# Patient Record
Sex: Female | Born: 1960 | Race: Asian | Hispanic: No | Marital: Married | Smoking: Never smoker
Health system: Southern US, Community
[De-identification: ages and names within clinical notes are randomized; demographics above are authoritative.]

## PROBLEM LIST (undated history)

## (undated) DIAGNOSIS — I10 Essential (primary) hypertension: Secondary | ICD-10-CM

## (undated) DIAGNOSIS — B1910 Unspecified viral hepatitis B without hepatic coma: Secondary | ICD-10-CM

## (undated) DIAGNOSIS — R7303 Prediabetes: Secondary | ICD-10-CM

## (undated) DIAGNOSIS — M8580 Other specified disorders of bone density and structure, unspecified site: Secondary | ICD-10-CM

## (undated) DIAGNOSIS — K760 Fatty (change of) liver, not elsewhere classified: Secondary | ICD-10-CM

## (undated) HISTORY — DX: Unspecified viral hepatitis B without hepatic coma: B19.10

## (undated) HISTORY — DX: Fatty (change of) liver, not elsewhere classified: K76.0

## (undated) HISTORY — DX: Other specified disorders of bone density and structure, unspecified site: M85.80

## (undated) HISTORY — DX: Prediabetes: R73.03

## (undated) HISTORY — PX: CHOLECYSTECTOMY: SHX55

## (undated) HISTORY — DX: Essential (primary) hypertension: I10

---

## 2007-03-10 DIAGNOSIS — B181 Chronic viral hepatitis B without delta-agent: Secondary | ICD-10-CM | POA: Insufficient documentation

## 2014-11-25 ENCOUNTER — Other Ambulatory Visit: Payer: Self-pay | Admitting: Unknown Physician Specialty

## 2014-11-25 ENCOUNTER — Ambulatory Visit (INDEPENDENT_AMBULATORY_CARE_PROVIDER_SITE_OTHER): Payer: 59

## 2014-11-25 DIAGNOSIS — M545 Low back pain: Secondary | ICD-10-CM

## 2015-11-21 DIAGNOSIS — Z8619 Personal history of other infectious and parasitic diseases: Secondary | ICD-10-CM | POA: Insufficient documentation

## 2016-02-07 ENCOUNTER — Encounter: Payer: Self-pay | Admitting: *Deleted

## 2016-08-23 DIAGNOSIS — B009 Herpesviral infection, unspecified: Secondary | ICD-10-CM | POA: Insufficient documentation

## 2016-10-17 IMAGING — CR DG LUMBAR SPINE COMPLETE 4+V
5 series · 5 of 5 positions shown · non-contrast
Comparison: None.

CLINICAL DATA: Left lumbar spine pain for 6 months with no injury

EXAM:
LUMBAR SPINE - COMPLETE 4+ VIEW

[l-spine ap]
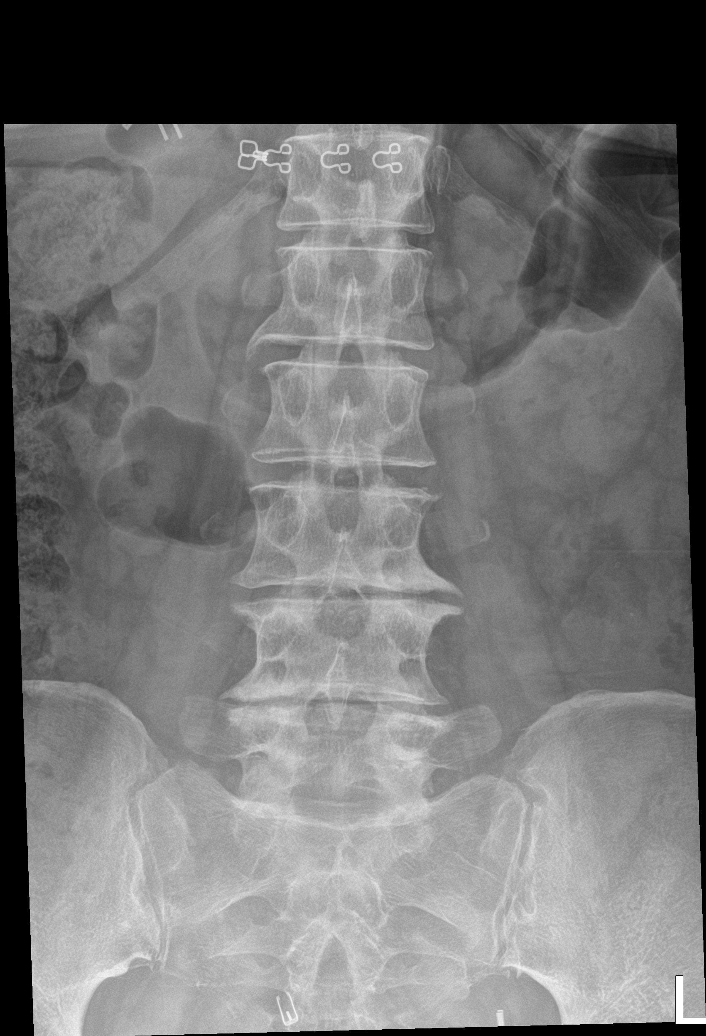

[l-spine obl (1 of 2)]
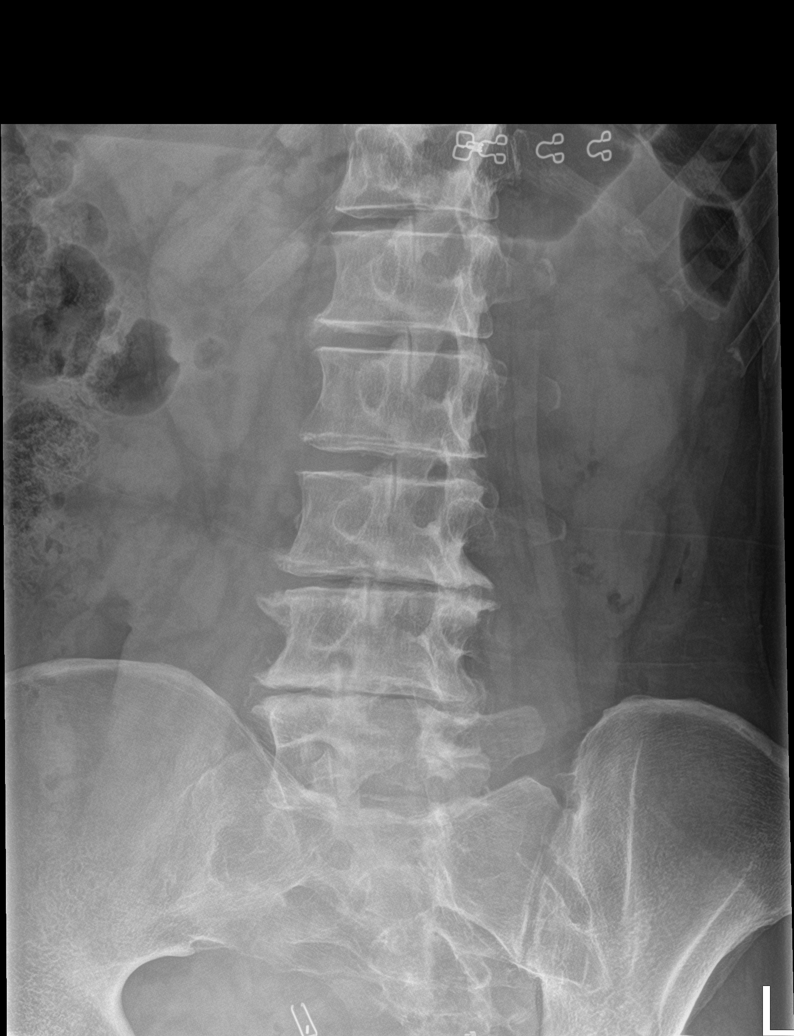

[l-spine obl (2 of 2)]
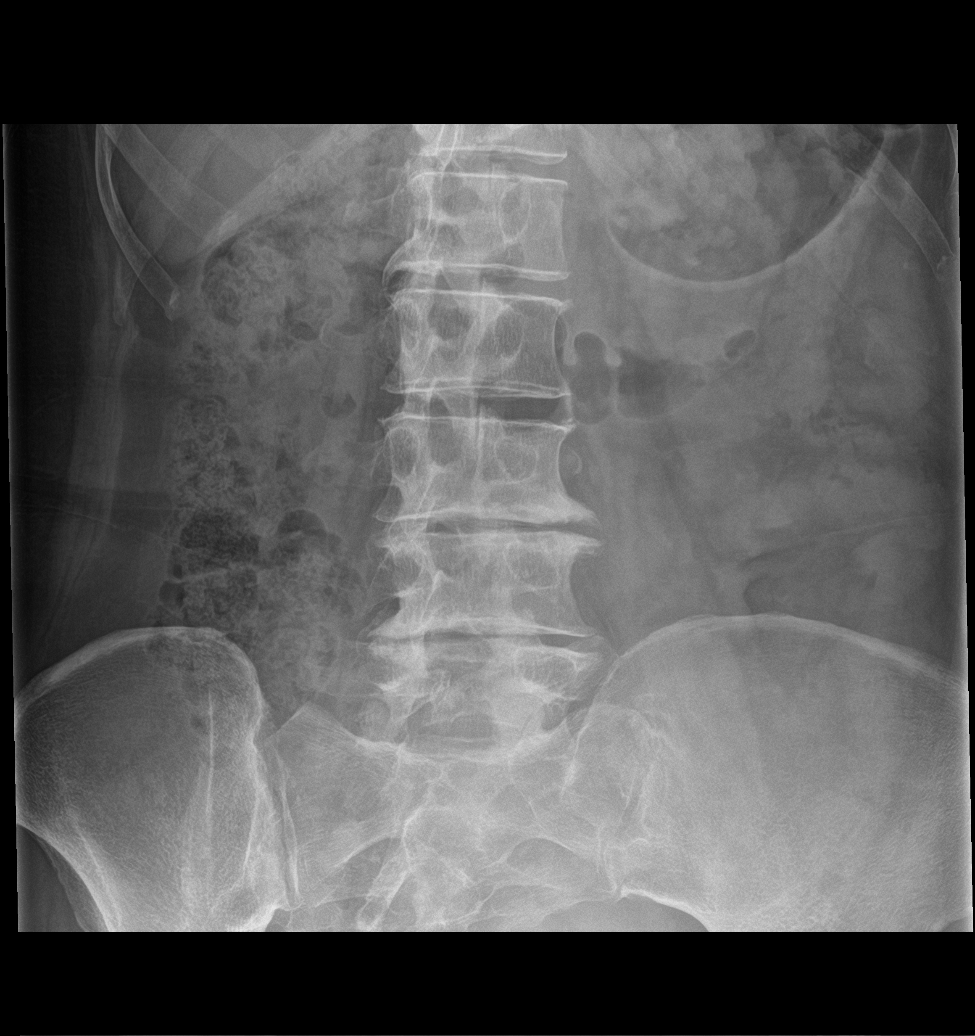

[l-spine lat]
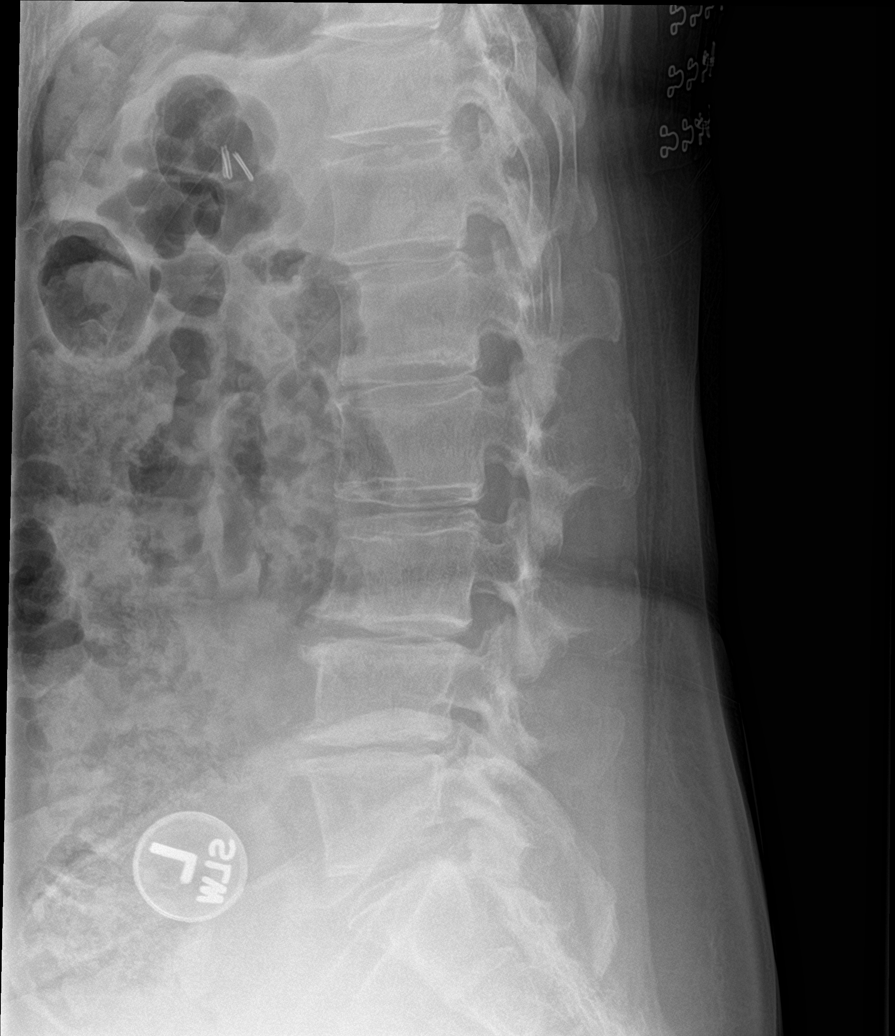

[l-spine spot]
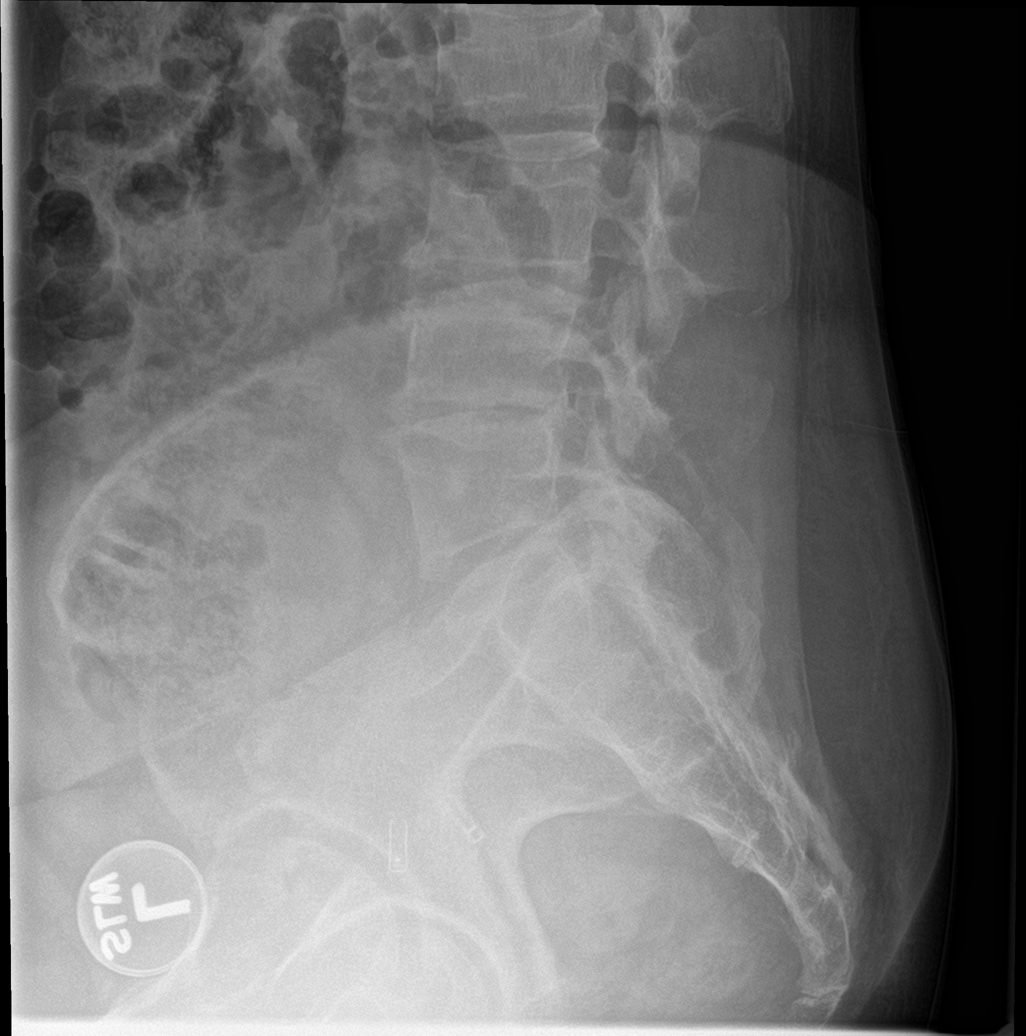

[5 of 5 positions shown; findings below may reference images not displayed]

FINDINGS: Reversed lordosis. No fracture. Mild degenerative disc disease at
L1-2 and L2-3. Moderate L3-4 and L4-5 degenerative disc disease.
Facet arthropathy bilaterally at L4-5 and L5-S1.
IMPRESSION: Degenerative changes

## 2017-01-08 ENCOUNTER — Encounter: Payer: Self-pay | Admitting: *Deleted

## 2017-01-08 DIAGNOSIS — Z23 Encounter for immunization: Secondary | ICD-10-CM

## 2017-01-10 ENCOUNTER — Encounter: Payer: Self-pay | Admitting: *Deleted

## 2017-01-10 NOTE — Congregational Nurse Program (Unsigned)
Congregational Nurse Program Note  Date of Encounter:01/08/2017 Past Medical History: No past medical history on file.  Encounter Details:  flu shot given on 01/08/2017

## 2017-05-20 DIAGNOSIS — M8588 Other specified disorders of bone density and structure, other site: Secondary | ICD-10-CM | POA: Insufficient documentation

## 2017-07-04 ENCOUNTER — Encounter: Payer: Self-pay | Admitting: *Deleted

## 2017-07-04 DIAGNOSIS — Z139 Encounter for screening, unspecified: Secondary | ICD-10-CM

## 2017-07-04 LAB — GLUCOSE, POCT (MANUAL RESULT ENTRY): POC Glucose: 155 mg/dl — AB (ref 70–99)

## 2018-02-04 ENCOUNTER — Encounter: Payer: Self-pay | Admitting: *Deleted

## 2018-03-22 ENCOUNTER — Encounter: Payer: Self-pay | Admitting: *Deleted

## 2018-03-22 NOTE — Congregational Nurse Program (Signed)
  Dept: [phone]   Congregational Nurse Program Note  Date of Encounter: 03/21/2018 Past Medical History: No past medical history on file.  Encounter Details:  c/o BP up, checking BP, was high. Advised to call MD, ask BP med. Law salt diet and exercise recommended. Will f/u

## 2018-05-11 ENCOUNTER — Encounter: Payer: Self-pay | Admitting: *Deleted

## 2018-05-11 NOTE — Congregational Nurse Program (Unsigned)
  Dept: [phone]   Congregational Nurse Program Note  Date of Encounter: 05/11/2018  Past Medical History: No past medical history on file.  Encounter Details: CNP Questionnaire - 05/11/18 1055      Questionnaire   Patient Status  Immigrant    Race  Asian    Location Patient Served At  Not Applicable    Insurance  Private Insurance    Uninsured  Not Applicable    Food  No food insecurities    Housing/Utilities  Yes, have permanent housing    Transportation  No transportation needs    Interpersonal Safety  Yes, feel physically and emotionally safe where you currently live    Medication  No medication insecurities    Medical Provider  Yes    Referrals  Not Applicable    ED Visit Averted  Not Applicable    Life-Saving Intervention Made  Not Applicable     check BP, remains high.  Pt said she is taking beets ,apple and carrot smoothie every day for HTN, advised to go to Dr. And get BP med. Control BP then do food therapy. Also stated when she took BP was not that high. Will F/U.

## 2019-05-28 ENCOUNTER — Telehealth: Payer: Self-pay | Admitting: *Deleted

## 2019-05-28 NOTE — Telephone Encounter (Signed)
Pt said she fell on Monday at home, hit head. Been ER on Monday. CT was negative bleeding , remains HA.  She did not remember anything why where, when fell at home, just remembered her daughter told her to go to ER. Reminded her to follow up with Dr. To find out why she had syncopal episode. Example of heart? Or brain. She is taking BP med but was not low BP. Will f/u with her.

## 2019-06-12 DIAGNOSIS — I10 Essential (primary) hypertension: Secondary | ICD-10-CM | POA: Insufficient documentation

## 2019-07-09 ENCOUNTER — Ambulatory Visit: Payer: 59 | Attending: Internal Medicine

## 2019-07-09 DIAGNOSIS — Z23 Encounter for immunization: Secondary | ICD-10-CM

## 2019-07-09 NOTE — Progress Notes (Signed)
   Covid-19 Vaccination Clinic  Name:  Rylin Saez    MRN: [id number] DOB: 1960/06/07  07/09/2019  Ms. Latona was observed post Covid-19 immunization for 15 minutes without incident. She was provided with Vaccine Information Sheet and instruction to access the V-Safe system.   Ms. Holtmeyer was instructed to call 911 with any severe reactions post vaccine: Marland Kitchen Difficulty breathing  . Swelling of face and throat  . A fast heartbeat  . A bad rash all over body  . Dizziness and weakness   Immunizations Administered    Name Date Dose VIS Date Route   Pfizer COVID-19 Vaccine 07/09/2019 10:26 AM 0.3 mL 04/10/2019 Intramuscular   Manufacturer: [employer], [employer]   Lot: [id number]   NDC: [address]-2

## 2019-08-03 ENCOUNTER — Ambulatory Visit: Payer: 59 | Attending: Internal Medicine

## 2019-08-03 DIAGNOSIS — Z23 Encounter for immunization: Secondary | ICD-10-CM

## 2019-08-03 NOTE — Progress Notes (Signed)
   Covid-19 Vaccination Clinic  Name:  Vergie Zahm    MRN: [id number] DOB: [birth year]  08/03/2019  Ms. Tye was observed post Covid-19 immunization for 15 minutes without incident. She was provided with Vaccine Information Sheet and instruction to access the V-Safe system.   Ms. Reith was instructed to call 911 with any severe reactions post vaccine: Marland Kitchen Difficulty breathing  . Swelling of face and throat  . A fast heartbeat  . A bad rash all over body  . Dizziness and weakness   Immunizations Administered    Name Date Dose VIS Date Route   Pfizer COVID-19 Vaccine 08/03/2019  9:59 AM 0.3 mL 04/10/2019 Intramuscular   Manufacturer: [employer], [employer]   Lot: [id number]   NDC: [address]-2

## 2020-01-03 ENCOUNTER — Encounter: Payer: Self-pay | Admitting: *Deleted

## 2021-03-05 DIAGNOSIS — R7303 Prediabetes: Secondary | ICD-10-CM | POA: Insufficient documentation

## 2021-03-05 DIAGNOSIS — K760 Fatty (change of) liver, not elsewhere classified: Secondary | ICD-10-CM | POA: Insufficient documentation

## 2021-09-05 DIAGNOSIS — M4722 Other spondylosis with radiculopathy, cervical region: Secondary | ICD-10-CM | POA: Insufficient documentation

## 2022-05-16 LAB — HM DEXA SCAN

## 2022-06-28 DIAGNOSIS — B351 Tinea unguium: Secondary | ICD-10-CM | POA: Insufficient documentation

## 2022-06-28 DIAGNOSIS — E663 Overweight: Secondary | ICD-10-CM | POA: Insufficient documentation

## 2022-06-28 DIAGNOSIS — R609 Edema, unspecified: Secondary | ICD-10-CM | POA: Insufficient documentation

## 2022-08-14 DIAGNOSIS — Z8639 Personal history of other endocrine, nutritional and metabolic disease: Secondary | ICD-10-CM | POA: Insufficient documentation

## 2023-03-27 LAB — LIPID PANEL: LDL Cholesterol: 122

## 2023-03-27 LAB — HEMOGLOBIN A1C: Hemoglobin A1C: 5.7

## 2023-04-03 LAB — HM PAP SMEAR: HM Pap smear: NORMAL

## 2023-09-18 ENCOUNTER — Ambulatory Visit (INDEPENDENT_AMBULATORY_CARE_PROVIDER_SITE_OTHER): Payer: Self-pay | Admitting: Family Medicine

## 2023-09-18 ENCOUNTER — Encounter: Payer: Self-pay | Admitting: Family Medicine

## 2023-09-18 VITALS — BP 146/86 | HR 75 | Temp 97.8°F | Ht 62.0 in | Wt 137.1 lb

## 2023-09-18 DIAGNOSIS — B181 Chronic viral hepatitis B without delta-agent: Secondary | ICD-10-CM

## 2023-09-18 DIAGNOSIS — Z87440 Personal history of urinary (tract) infections: Secondary | ICD-10-CM | POA: Insufficient documentation

## 2023-09-18 DIAGNOSIS — R7303 Prediabetes: Secondary | ICD-10-CM

## 2023-09-18 DIAGNOSIS — I10 Essential (primary) hypertension: Secondary | ICD-10-CM

## 2023-09-18 DIAGNOSIS — G479 Sleep disorder, unspecified: Secondary | ICD-10-CM | POA: Diagnosis not present

## 2023-09-18 NOTE — Assessment & Plan Note (Signed)
 Currently taking Macrobid 100 mg BID.  This is day # 3. Symptoms are slowly improving. No fever of flank pain.  Recommend completing treatment, if symptoms are not resolved with send urine for culture. Repeat u/a in 2 weeks to ensure resolution of hematuria.

## 2023-09-18 NOTE — Assessment & Plan Note (Signed)
 Currently managed by infectious disease.

## 2023-09-18 NOTE — Assessment & Plan Note (Signed)
 Reports irregular sleep patterns. Sometimes stays up all night and sleeps during the day. Recommend avoiding all daytime sleep and good sleep hygiene.  Continue to monitor.

## 2023-09-18 NOTE — Progress Notes (Signed)
 New Patient Office Visit  Subjective    Patient ID: Anna Mora, female    DOB: 1960/11/30  Age: 63 y.o. MRN: [id number]  CC:  Chief Complaint  Patient presents with   New Patient (Initial Visit)    [facility] pt is being treated for an UTI as well with nitrofurantoin, macrocrystal-monohydrate, (MACROBID) 100 MG capsule. She is on her 3rd day     HPI Anna Mora presents to establish care with this practice. She is new to me.   UTI:  Went to urgent care for UTI and is on treatment. No culture was sent.  Temperature 99.0 yesterday. Continues to have pain with urination.  No flank pain. Does not feel ill.   HTN: losartan 50 mg daily. Has not taken blood pressure medicine today. Elevated today.   Pre- Diabetes: metformin 500 mg every day.  Not sure when last A1C was checked.   Sleeping habits: sometimes stays up all night and naps during the day.   Chart review: 09/16/23: [facility] for [facility] Started on Macrobid. Blood in urine. Positive leukocytes  Outpatient Encounter Medications as of 09/18/2023  Medication Sig   albuterol (VENTOLIN HFA) 108 (90 Base) MCG/ACT inhaler Inhale 2 puffs into the lungs every 6 (six) hours as needed.   entecavir (BARACLUDE) 0.5 MG tablet Take 0.5 mg by mouth daily.   losartan (COZAAR) 50 MG tablet Take 50 mg by mouth daily.   metFORMIN (GLUCOPHAGE-XR) 500 MG 24 hr tablet Take 500 mg by mouth daily with breakfast.   nitrofurantoin, macrocrystal-monohydrate, (MACROBID) 100 MG capsule Take 100 mg by mouth.   tenofovir (VIREAD) 300 MG tablet Take 300 mg by mouth daily.   valACYclovir (VALTREX) 1000 MG tablet Take 1,000 mg by mouth daily.   [DISCONTINUED] estradiol (ESTRACE) 0.1 MG/GM vaginal cream Place 1 Applicatorful vaginally once a week.   No facility-administered encounter medications on file as of 09/18/2023.    Past Medical History:  Diagnosis Date   Hepatitis B    High blood pressure     Past Surgical History:  Procedure  Laterality Date   CHOLECYSTECTOMY      History reviewed. No pertinent family history.  Social History   Socioeconomic History   Marital status: Married    Spouse name: Not on file   Number of children: 2   Years of education: Not on file   Highest education level: Not on file  Occupational History   Not on file  Tobacco Use   Smoking status: Never   Smokeless tobacco: Never  Substance and Sexual Activity   Alcohol use: Not on file   Drug use: Never   Sexual activity: Not on file  Other Topics Concern   Not on file  Social History Narrative   Not on file   Social Drivers of Health   Financial Resource Strain: Low Risk  (07/01/2023)   Received from [facility]   Overall Financial Resource Strain (CARDIA)    Difficulty of Paying Living Expenses: Not hard at all  Food Insecurity: No Food Insecurity (07/01/2023)   Received from [facility]   Hunger Vital Sign    Worried About Running Out of Food in the Last Year: Never true    Ran Out of Food in the Last Year: Never true  Transportation Needs: No Transportation Needs (07/01/2023)   Received from [facility] - Transportation    Lack of Transportation (Medical): No    Lack of Transportation (Non-Medical): No  Physical  Activity: Unknown (03/31/2023)   Received from [facility]   Exercise Vital Sign    Days of Exercise per Week: 3 days    Minutes of Exercise per Session: Not on file  Stress: Patient Declined (03/31/2023)   Received from [facility] - Occupational Stress Questionnaire    Feeling of Stress : Patient declined  Social Connections: Socially Integrated (03/31/2023)   Received from [facility]   Social Network    How would you rate your social network (family, work, friends)?: Good participation with social networks  Intimate Partner Violence: Not At Risk (03/31/2023)   Received from [facility]   HITS    Over the last 12 months how often did your  partner physically hurt you?: Never    Over the last 12 months how often did your partner insult you or talk down to you?: Never    Over the last 12 months how often did your partner threaten you with physical harm?: Never    Over the last 12 months how often did your partner scream or curse at you?: Never    Review of Systems  Eyes:  Negative for blurred vision and double vision.  Respiratory:  Negative for shortness of breath.   Cardiovascular:  Negative for chest pain.  Neurological:  Negative for headaches.        Objective    BP (!) 146/86 (BP Location: Right Arm, Patient Position: Sitting, Cuff Size: Normal)   Pulse 75   Temp 97.8 F (36.6 C) (Oral)   Ht 5\' 2"  (1.575 m)   Wt 137 lb 1 oz (62.2 kg)   SpO2 100%   BMI 25.07 kg/m   Physical Exam Vitals and nursing note reviewed.  Constitutional:      General: She is not in acute distress.    Appearance: Normal appearance. She is normal weight.  Cardiovascular:     Rate and Rhythm: Regular rhythm.     Heart sounds: Normal heart sounds.  Pulmonary:     Effort: Pulmonary effort is normal.     Breath sounds: Normal breath sounds.  Skin:    General: Skin is warm and dry.  Neurological:     General: No focal deficit present.     Mental Status: She is alert. Mental status is at baseline.  Psychiatric:        Mood and Affect: Mood normal.        Behavior: Behavior normal.        Thought Content: Thought content normal.        Judgment: Judgment normal.        Assessment & Plan:   Essential hypertension Assessment & Plan: Takes losartan 50 mg daily.  Did not take today before this visit. Elevated in office. Denies chest pain, shortness of breath, vision changes, and headaches. Instructed to take losartan as soon as possible today. No refills needed. Follow-up in 2 weeks for blood pressure check.   Orders: -     Comprehensive metabolic panel with GFR  Prediabetes Assessment & Plan: Taking  metformin 500  mg daily. Unsure of last A1C. Recheck A1C today.   Orders: -     Comprehensive metabolic panel with GFR -     Hemoglobin A1c  History of UTI Assessment & Plan: Currently taking Macrobid 100 mg BID.  This is day # 3. Symptoms are slowly improving. No fever of flank pain.  Recommend completing treatment, if symptoms are not resolved  with send urine for culture. Repeat u/a in 2 weeks to ensure resolution of hematuria.    Sleep disturbance Assessment & Plan: Reports irregular sleep patterns. Sometimes stays up all night and sleeps during the day. Recommend avoiding all daytime sleep and good sleep hygiene.  Continue to monitor.    Chronic hepatitis B (HCC) Assessment & Plan: Currently managed by infectious disease.      Agrees with plan of care discussed.  Questions answered.   Return in about 12 days (around 09/30/2023) for blood pressure and hematuria .   Mickiel Albany, FNP

## 2023-09-18 NOTE — Patient Instructions (Addendum)
 Please take blood pressure medicine before next office visit.

## 2023-09-18 NOTE — Assessment & Plan Note (Signed)
 Takes losartan 50 mg daily.  Did not take today before this visit. Elevated in office. Denies chest pain, shortness of breath, vision changes, and headaches. Instructed to take losartan as soon as possible today. No refills needed. Follow-up in 2 weeks for blood pressure check.

## 2023-09-18 NOTE — Assessment & Plan Note (Signed)
 Taking  metformin 500 mg daily. Unsure of last A1C. Recheck A1C today.

## 2023-09-19 ENCOUNTER — Ambulatory Visit: Payer: Self-pay | Admitting: Family Medicine

## 2023-09-19 LAB — COMPREHENSIVE METABOLIC PANEL WITH GFR
ALT: 23 IU/L (ref 0–32)
AST: 28 IU/L (ref 0–40)
Albumin: 4.7 g/dL (ref 3.9–4.9)
Alkaline Phosphatase: 69 IU/L (ref 44–121)
BUN/Creatinine Ratio: 27 (ref 12–28)
BUN: 22 mg/dL (ref 8–27)
Bilirubin Total: 1 mg/dL (ref 0.0–1.2)
CO2: 23 mmol/L (ref 20–29)
Calcium: 9.7 mg/dL (ref 8.7–10.3)
Chloride: 102 mmol/L (ref 96–106)
Creatinine, Ser: 0.81 mg/dL (ref 0.57–1.00)
Globulin, Total: 2.4 g/dL (ref 1.5–4.5)
Glucose: 91 mg/dL (ref 70–99)
Potassium: 4.7 mmol/L (ref 3.5–5.2)
Sodium: 142 mmol/L (ref 134–144)
Total Protein: 7.1 g/dL (ref 6.0–8.5)
eGFR: 82 mL/min/{1.73_m2} (ref >59)

## 2023-09-19 LAB — HEMOGLOBIN A1C
Est. average glucose Bld gHb Est-mCnc: 114 mg/dL
Hgb A1c MFr Bld: 5.6 % (ref 4.8–5.6)

## 2023-09-30 ENCOUNTER — Ambulatory Visit (INDEPENDENT_AMBULATORY_CARE_PROVIDER_SITE_OTHER): Admitting: Family Medicine

## 2023-09-30 ENCOUNTER — Encounter: Payer: Self-pay | Admitting: Family Medicine

## 2023-09-30 VITALS — BP 120/74 | HR 66 | Temp 97.9°F | Ht 62.0 in | Wt 138.0 lb

## 2023-09-30 DIAGNOSIS — I10 Essential (primary) hypertension: Secondary | ICD-10-CM | POA: Diagnosis not present

## 2023-09-30 DIAGNOSIS — R319 Hematuria, unspecified: Secondary | ICD-10-CM

## 2023-09-30 DIAGNOSIS — N3001 Acute cystitis with hematuria: Secondary | ICD-10-CM | POA: Insufficient documentation

## 2023-09-30 LAB — POCT URINALYSIS DIP (CLINITEK)
Glucose, UA: 100 mg/dL — AB
Nitrite, UA: POSITIVE — AB
POC PROTEIN,UA: 100 — AB
Spec Grav, UA: 1.02 (ref 1.010–1.025)
Urobilinogen, UA: 2 U/dL — AB
pH, UA: 5 (ref 5.0–8.0)

## 2023-09-30 MED ORDER — NITROFURANTOIN MONOHYD MACRO 100 MG PO CAPS: 100.0000 mg | ORAL_CAPSULE | Freq: Two times a day (BID) | ORAL | 0 refills | Status: DC

## 2023-09-30 NOTE — Assessment & Plan Note (Addendum)
 Recurring symptoms after completing treatment previously. Urine dip positive for blood, nitrites, and leukocytes.  No flank pain. No fever.  Send for culture. Nitrofurantoin 100 mg BID for 10 days. Extended treatment due to recurring symptoms. Denies being sexually active, urinary incontinence, or wearing wet clothing. If continues to have recurrence, will send to urology for evaluation.  May need to change treatment after culture results.

## 2023-09-30 NOTE — Assessment & Plan Note (Signed)
 Taking losartan 50 mg daily as prescribed.  Denies chest pain, shortness of breath, lower extremity edema, vision changes, headaches.  Pertinent lab work: 5/21 CMP normal  Well controlled in office today. Continue current regimen.  No refills needed today.

## 2023-09-30 NOTE — Progress Notes (Signed)
 Established Patient Office Visit  Subjective   Patient ID: Anna Mora, female    DOB: 22-Feb-1961  Age: 63 y.o. MRN: [id number]  Chief Complaint  Patient presents with   Hypertension    Follow up   Hematuria    Follow up. Still having painful urination. Took 2 doses of Azo.    HPI  Hypertension Medication compliance: Taking losartan 50 mg daily as prescribed.  Denies chest pain, shortness of breath, lower extremity edema, vision changes, headaches.  Pertinent lab work: 5/21 CMP normal  Monitoring at home: varies at home, well controlled in office today.  Tolerating medication well: no side effects reported.  Continue current medication regimen: no change Follow-up: 3 months  Urinary burning, urgency, and frequency: Urine positive for blood, nitrites and leukocytes.  Diagnosed with UTI and treated recently.  Symptoms have returned. Symptoms started yesterday. No flank pain. No fever.       Review of Systems  Constitutional:  Negative for fever.  Eyes:  Negative for blurred vision and double vision.  Respiratory:  Negative for shortness of breath.   Cardiovascular:  Negative for chest pain.  Genitourinary:  Positive for dysuria, frequency, hematuria and urgency. Negative for flank pain.  Neurological:  Negative for headaches.      Objective:     BP 120/74 (BP Location: Left Arm, Patient Position: Sitting, Cuff Size: Normal)   Pulse 66   Temp 97.9 F (36.6 C) (Oral)   Ht 5\' 2"  (1.575 m)   Wt 138 lb (62.6 kg)   LMP  (LMP Unknown)   SpO2 96%   BMI 25.24 kg/m    Physical Exam Vitals and nursing note reviewed.  Constitutional:      Appearance: Normal appearance. She is normal weight. She is not ill-appearing.  Cardiovascular:     Rate and Rhythm: Regular rhythm.     Heart sounds: Normal heart sounds.  Pulmonary:     Effort: Pulmonary effort is normal.     Breath sounds: Normal breath sounds.  Abdominal:     Tenderness: There is no right CVA  tenderness or left CVA tenderness.  Skin:    General: Skin is warm and dry.  Neurological:     General: No focal deficit present.     Mental Status: She is alert. Mental status is at baseline.  Psychiatric:        Mood and Affect: Mood normal.        Behavior: Behavior normal.        Thought Content: Thought content normal.        Judgment: Judgment normal.     Results for orders placed or performed in visit on 09/30/23  POCT URINALYSIS DIP (CLINITEK)  Result Value Ref Range   Color, UA orange (A) yellow   Clarity, UA cloudy (A) clear   Glucose, UA =100 (A) negative mg/dL   Bilirubin, UA small (A) negative   Ketones, POC UA trace (5) (A) negative mg/dL   Spec Grav, UA 4.098 1.191 - 1.025   Blood, UA moderate (A) negative   pH, UA 5.0 5.0 - 8.0   POC PROTEIN,UA =100 (A) negative, trace   Urobilinogen, UA 2.0 (A) 0.2 or 1.0 E.U./dL   Nitrite, UA Positive (A) Negative   Leukocytes, UA Large (3+) (A) Negative      The 10-year ASCVD risk score (Arnett DK, et al., 2019) is: 4.8%    Assessment & Plan:   Problem List Items Addressed This Visit  Essential hypertension - Primary   Taking losartan 50 mg daily as prescribed.  Denies chest pain, shortness of breath, lower extremity edema, vision changes, headaches.  Pertinent lab work: 5/21 CMP normal  Well controlled in office today. Continue current regimen.  No refills needed today.       RESOLVED: Hematuria   Relevant Orders   POCT URINALYSIS DIP (CLINITEK) (Completed)   Acute cystitis with hematuria   Recurring symptoms after completing treatment previously. Urine dip positive for blood, nitrites, and leukocytes.  No flank pain. No fever.  Send for culture. Nitrofurantoin 100 mg BID for 10 days. Extended treatment due to recurring symptoms. Denies being sexually active, urinary incontinence, or wearing wet clothing. If continues to have recurrence, will send to urology for evaluation.  May need to change  treatment after culture results.         Relevant Medications   nitrofurantoin, macrocrystal-monohydrate, (MACROBID) 100 MG capsule   Other Relevant Orders   Urine Culture  Agrees with plan of care discussed.  Questions answered.   Return in about 4 weeks (around 10/28/2023) for UTI and hematuria .    Mickiel Albany, FNP

## 2023-10-02 ENCOUNTER — Ambulatory Visit: Payer: Self-pay | Admitting: Family Medicine

## 2023-10-02 LAB — URINE CULTURE

## 2023-10-28 ENCOUNTER — Encounter: Payer: Self-pay | Admitting: Family Medicine

## 2023-10-28 ENCOUNTER — Ambulatory Visit (INDEPENDENT_AMBULATORY_CARE_PROVIDER_SITE_OTHER): Admitting: Family Medicine

## 2023-10-28 VITALS — BP 101/74 | HR 77 | Temp 98.6°F | Ht 62.0 in | Wt 139.0 lb

## 2023-10-28 DIAGNOSIS — R7303 Prediabetes: Secondary | ICD-10-CM

## 2023-10-28 DIAGNOSIS — R300 Dysuria: Secondary | ICD-10-CM | POA: Insufficient documentation

## 2023-10-28 DIAGNOSIS — L609 Nail disorder, unspecified: Secondary | ICD-10-CM | POA: Diagnosis not present

## 2023-10-28 DIAGNOSIS — I10 Essential (primary) hypertension: Secondary | ICD-10-CM

## 2023-10-28 DIAGNOSIS — R319 Hematuria, unspecified: Secondary | ICD-10-CM | POA: Diagnosis not present

## 2023-10-28 LAB — POCT URINALYSIS DIP (CLINITEK)
Bilirubin, UA: NEGATIVE
Blood, UA: NEGATIVE
Glucose, UA: NEGATIVE mg/dL
Ketones, POC UA: NEGATIVE mg/dL
Leukocytes, UA: NEGATIVE
Nitrite, UA: NEGATIVE
POC PROTEIN,UA: NEGATIVE
Spec Grav, UA: >=1.03 — AB (ref 1.010–1.025)
Urobilinogen, UA: 0.2 U/dL
pH, UA: 6 (ref 5.0–8.0)

## 2023-10-28 MED ORDER — METFORMIN HCL ER 500 MG PO TB24: 500.0000 mg | ORAL_TABLET | Freq: Every day | ORAL | 1 refills | Status: DC

## 2023-10-28 MED ORDER — LOSARTAN POTASSIUM 50 MG PO TABS: 50.0000 mg | ORAL_TABLET | Freq: Every day | ORAL | 1 refills | Status: DC

## 2023-10-28 NOTE — Assessment & Plan Note (Signed)
 Refills sent. Well controlled. Follow-up at CPE.

## 2023-10-28 NOTE — Progress Notes (Signed)
 Established Patient Office Visit  Subjective   Patient ID: Anna Mora, female    DOB: 28-Dec-1960  Age: 63 y.o. MRN: [id number]  Chief Complaint  Patient presents with   Medical Management of Chronic Issues    4 week fup  on dysuria    Follow-up for hematuria. UTI symptoms have resolved. No fever or flank pain. Urine dip is negative for hematuria and signs of infection.  Left great toe nail with white line under nail. This has gotten larger. No pain. Wants to see podiatrist for evaluation.       Review of Systems  Genitourinary:  Negative for dysuria, flank pain, frequency, hematuria and urgency.      Objective:     BP 101/74   Pulse 77   Temp 98.6 F (37 C) (Oral)   Ht 5' 2 (1.575 m)   Wt 139 lb (63 kg)   LMP  (LMP Unknown)   SpO2 100%   BMI 25.42 kg/m  BP Readings from Last 3 Encounters:  10/28/23 101/74  09/30/23 120/74  09/18/23 (!) 146/86      Physical Exam Vitals and nursing note reviewed.  Constitutional:      General: She is not in acute distress.    Appearance: Normal appearance.   Cardiovascular:     Rate and Rhythm: Normal rate.  Pulmonary:     Effort: Pulmonary effort is normal.     Breath sounds: Normal breath sounds.  Feet:     Left foot:     Skin integrity: Skin integrity normal.     Comments: White colored line under left great toe nail. No redness or signs of infection.   Skin:    General: Skin is warm and dry.   Neurological:     General: No focal deficit present.     Mental Status: She is alert. Mental status is at baseline.   Psychiatric:        Mood and Affect: Mood normal.        Behavior: Behavior normal.        Thought Content: Thought content normal.        Judgment: Judgment normal.     Results for orders placed or performed in visit on 10/28/23  POCT URINALYSIS DIP (CLINITEK)  Result Value Ref Range   Color, UA yellow yellow   Clarity, UA clear clear   Glucose, UA negative negative mg/dL    Bilirubin, UA negative negative   Ketones, POC UA negative negative mg/dL   Spec Grav, UA >=8.969 (A) 1.010 - 1.025   Blood, UA negative negative   pH, UA 6.0 5.0 - 8.0   POC PROTEIN,UA negative negative, trace   Urobilinogen, UA 0.2 0.2 or 1.0 E.U./dL   Nitrite, UA Negative Negative   Leukocytes, UA Negative Negative    Last metabolic panel Lab Results  Component Value Date   GLUCOSE 91 09/18/2023   NA 142 09/18/2023   K 4.7 09/18/2023   CL 102 09/18/2023   CO2 23 09/18/2023   BUN 22 09/18/2023   CREATININE 0.81 09/18/2023   EGFR 82 09/18/2023   CALCIUM 9.7 09/18/2023   PROT 7.1 09/18/2023   ALBUMIN 4.7 09/18/2023   LABGLOB 2.4 09/18/2023   BILITOT 1.0 09/18/2023   ALKPHOS 69 09/18/2023   AST 28 09/18/2023   ALT 23 09/18/2023   Last hemoglobin A1c Lab Results  Component Value Date   HGBA1C 5.6 09/18/2023      The 10-year ASCVD risk score (Arnett  DK, et al., 2019) is: 3.4%    Assessment & Plan:   Problem List Items Addressed This Visit     Essential hypertension   Refills sent. Well controlled. Follow-up at CPE.       Relevant Medications   losartan (COZAAR) 50 MG tablet   Prediabetes   Refill sent. Follow-up at CPE with labs.      Relevant Medications   metFORMIN (GLUCOPHAGE-XR) 500 MG 24 hr tablet   Hematuria - Primary   Urine dip negative for hematuria and evidence of infection.       Relevant Orders   POCT URINALYSIS DIP (CLINITEK) (Completed)   Nail abnormality   Referral placed to podiatry.       Relevant Orders   Ambulatory referral to Podiatry  Refill sent for chronic conditions. Not included in medical decision making today.  Agrees with plan of care discussed.  Questions answered.   Return in about 22 weeks (around 03/30/2024) for CPE with labs.    Darice JONELLE Brownie, FNP

## 2023-10-28 NOTE — Assessment & Plan Note (Signed)
 Refill sent. Follow-up at CPE with labs.

## 2023-10-28 NOTE — Assessment & Plan Note (Signed)
 Referral placed to podiatry.

## 2023-10-28 NOTE — Assessment & Plan Note (Signed)
 Urine dip negative for hematuria and evidence of infection.

## 2023-11-07 ENCOUNTER — Ambulatory Visit (INDEPENDENT_AMBULATORY_CARE_PROVIDER_SITE_OTHER): Admitting: Podiatry

## 2023-11-07 ENCOUNTER — Encounter: Payer: Self-pay | Admitting: Podiatry

## 2023-11-07 DIAGNOSIS — B351 Tinea unguium: Secondary | ICD-10-CM | POA: Diagnosis not present

## 2023-11-07 NOTE — Progress Notes (Signed)
  Subjective:  Patient ID: Anna Mora, female    DOB: 1960/11/24,   MRN: [id number]  Chief Complaint  Patient presents with   Nail Problem    I have something growing on my second toe on my right foot.  My toenail is changing color on that toe.  On my left big toe, I have a streak that is getting longer on the toenail.    63 y.o. female presents for concern as above.  . Denies any other pedal complaints. Denies n/v/f/c.   Past Medical History:  Diagnosis Date   Fatty liver    Hepatitis B    High blood pressure    Osteopenia    Prediabetes     Objective:  Physical Exam: Vascular: DP/PT pulses 2/4 bilateral. CFT <3 seconds. Normal hair growth on digits. No edema.  Skin. No lacerations or abrasions bilateral feet. Right second digit nail with distal medial thickened and discoloration. White streak noted to distal half of left hallux nail Musculoskeletal: MMT 5/5 bilateral lower extremities in DF, PF, Inversion and Eversion. Deceased ROM in DF of ankle joint.  Neurological: Sensation intact to light touch.   Assessment:   1. Onychomycosis      Plan:  Patient was evaluated and treated and all questions answered. -Examined patient -Discussed treatment options for painful dystrophic nails  -Clinical picture and Fungal culture was obtained by removing a portion of the hard nail itself from each of the involved toenails using a sterile nail nipper and sent to [facility] lab. Patient tolerated the biopsy procedure well without discomfort or need for anesthesia.  -Discussed fungal nail treatment options including oral, topical, and laser treatments.  -Patient to return in 4 weeks for follow up evaluation and discussion of fungal culture results or sooner if symptoms worsen.   Asberry Failing, DPM

## 2023-11-07 NOTE — Addendum Note (Signed)
 Addended by: WAYLAN ELIDIA PARAS on: 11/07/2023 11:55 AM   Modules accepted: Orders

## 2023-12-05 ENCOUNTER — Ambulatory Visit (INDEPENDENT_AMBULATORY_CARE_PROVIDER_SITE_OTHER): Admitting: Podiatry

## 2023-12-05 ENCOUNTER — Encounter: Payer: Self-pay | Admitting: Podiatry

## 2023-12-05 DIAGNOSIS — B351 Tinea unguium: Secondary | ICD-10-CM | POA: Diagnosis not present

## 2023-12-05 MED ORDER — TERBINAFINE HCL 250 MG PO TABS: 250.0000 mg | ORAL_TABLET | Freq: Every day | ORAL | 0 refills | Status: AC

## 2023-12-05 MED ORDER — CICLOPIROX 8 % EX SOLN: Freq: Every day | CUTANEOUS | 0 refills | Status: AC

## 2023-12-05 NOTE — Progress Notes (Signed)
  Subjective:  Patient ID: Anna Mora, female    DOB: 08-08-1960,   MRN: [id number]  No chief complaint on file.   63 y.o. female presents for follow-up of fungal nails and to discuss culture results.   . Denies any other pedal complaints. Denies n/v/f/c.   Past Medical History:  Diagnosis Date   Fatty liver    Hepatitis B    High blood pressure    Osteopenia    Prediabetes     Objective:  Physical Exam: Vascular: DP/PT pulses 2/4 bilateral. CFT <3 seconds. Normal hair growth on digits. No edema.  Skin. No lacerations or abrasions bilateral feet. Right second digit nail with distal medial thickened and discoloration. White streak noted to distal half of left hallux nail Musculoskeletal: MMT 5/5 bilateral lower extremities in DF, PF, Inversion and Eversion. Deceased ROM in DF of ankle joint.  Neurological: Sensation intact to light touch.   Assessment:   1. Onychomycosis       Plan:  Patient was evaluated and treated and all questions answered. -Examined patient -Discussed treatment options for painful dystrophic nails  -Culture positive for T rubrum.  -Discussed fungal nail treatment options including oral, topical, and laser treatments.  -Most recent LFTs wnl.  - Would like to try lamisil  and penlac . Sent to pharmacy.  -Patient to return in 3 months for recheck.    Asberry Failing, DPM

## 2024-03-06 ENCOUNTER — Encounter: Payer: Self-pay | Admitting: Podiatry

## 2024-03-06 ENCOUNTER — Ambulatory Visit (INDEPENDENT_AMBULATORY_CARE_PROVIDER_SITE_OTHER): Admitting: Podiatry

## 2024-03-06 DIAGNOSIS — M722 Plantar fascial fibromatosis: Secondary | ICD-10-CM | POA: Diagnosis not present

## 2024-03-06 DIAGNOSIS — B351 Tinea unguium: Secondary | ICD-10-CM

## 2024-03-06 NOTE — Patient Instructions (Signed)

## 2024-03-06 NOTE — Progress Notes (Signed)
  Subjective:  Patient ID: Anna Mora, female    DOB: 1960/07/28,   MRN: [id number]  Chief Complaint  Patient presents with   Nail Problem    It's getting better but it's still there.   Foot Pain    I developed a new problem.  My heels hurt, my left one is worst than the right one.    63 y.o. female presents for follow-up of fungal nails.  Relates new concern for bilateral heel pain.  Has been going on for several's.  Relates the left is worse than the right states most of the pain happens in the morning or steps.  She has tried some medication.  Relates this did help some. Denies any other pedal complaints. Denies n/v/f/c.   Past Medical History:  Diagnosis Date   Fatty liver    Hepatitis B    High blood pressure    Osteopenia    Prediabetes     Objective:  Physical Exam: Vascular: DP/PT pulses 2/4 bilateral. CFT <3 seconds. Normal hair growth on digits. No edema.  Skin. No lacerations or abrasions bilateral feet. Right second digit nail with distal medial thickened and discoloration. White streak noted to distal half of left hallux nail Musculoskeletal: MMT 5/5 bilateral lower extremities in DF, PF, Inversion and Eversion. Deceased ROM in DF of ankle joint.  Mildly tender to medial calcaneal tubercle of the left heel.  No pain with calcaneal squeeze.  No pain.  Arch or Achilles.  Similar on the right Neurological: Sensation intact to light touch.   Assessment:   1. Onychomycosis       Plan:  Patient was evaluated and treated and all questions answered. -Examined patient -Discussed treatment options for painful dystrophic nails  -Culture positive for T rubrum.  -Discussed fungal nail treatment options including oral, topical, and laser treatments.  -Will discontinue Lamisil . - Continue Penlac  until cleared  Discussed plantar fasciitis with patient.  X-rays reviewed and discussed with patient. No acute fractures or dislocations noted. Mild spurring noted at inferior  calcaneus.  Discussed treatment options including, ice, NSAIDS, supportive shoes, bracing, and stretching. Stretching exercises provided to be done on a daily basis.   Follow-up as needed     Asberry Failing, DPM

## 2024-03-30 ENCOUNTER — Ambulatory Visit (INDEPENDENT_AMBULATORY_CARE_PROVIDER_SITE_OTHER): Admitting: Family Medicine

## 2024-03-30 ENCOUNTER — Encounter: Payer: Self-pay | Admitting: Family Medicine

## 2024-03-30 VITALS — BP 135/80 | HR 84 | Ht 62.0 in | Wt 139.0 lb

## 2024-03-30 DIAGNOSIS — Z13228 Encounter for screening for other metabolic disorders: Secondary | ICD-10-CM | POA: Diagnosis not present

## 2024-03-30 DIAGNOSIS — Z0000 Encounter for general adult medical examination without abnormal findings: Secondary | ICD-10-CM | POA: Diagnosis not present

## 2024-03-30 DIAGNOSIS — Z13220 Encounter for screening for lipoid disorders: Secondary | ICD-10-CM

## 2024-03-30 DIAGNOSIS — Z130 Encounter for screening for diseases of the blood and blood-forming organs and certain disorders involving the immune mechanism: Secondary | ICD-10-CM | POA: Diagnosis not present

## 2024-03-30 DIAGNOSIS — Z87898 Personal history of other specified conditions: Secondary | ICD-10-CM | POA: Insufficient documentation

## 2024-03-30 DIAGNOSIS — Z136 Encounter for screening for cardiovascular disorders: Secondary | ICD-10-CM

## 2024-03-30 MED ORDER — ALBUTEROL SULFATE HFA 108 (90 BASE) MCG/ACT IN AERS: 2.0000 | INHALATION_SPRAY | Freq: Every evening | RESPIRATORY_TRACT | 0 refills | Status: AC | PRN

## 2024-03-30 NOTE — Assessment & Plan Note (Signed)
 Reports URI symptoms for one day. Wheezing when she goes to bed. Lungs are clear today in office.  No shortness of breath.  Albuterol inhaler 2 puffs before bed for wheezing. Follow-up if symptoms do not improve. Supportive therapy for URI.

## 2024-03-30 NOTE — Progress Notes (Signed)
 Complete physical exam  Patient: Anna Mora   DOB: 02-03-1961   63 y.o. Female  MRN: [id number]  Subjective:    Chief Complaint  Patient presents with   Annual Exam    Fasting - did take a cough drop but no medications Stated she called this morning and we told her we could also talk about the wheezing she is hearing at night when she lays down flat - onset yesterday    Anna Mora is a 63 y.o. female who presents today for a complete physical exam. She reports consuming a general diet. Walking 2-3 days per week for 30-40 minutes.  She generally feels well. She reports sleeping well. She does not have additional problems to discuss today.   Has cold with cough. Called to make sure she could come in today for this visit. Present for 1 days. Did not check her temperature.  Reports wheezing at night, lungs are clear in office today. Albuterol inhaler before bed. Follow-up if symptoms worsen.    Most recent fall risk assessment:    09/18/2023    9:28 AM  Fall Risk   Falls in the past year? 0  Number falls in past yr: 0  Injury with Fall? 0  Risk for fall due to : No Fall Risks  Follow up Falls evaluation completed     Most recent depression screenings:    03/30/2024    9:10 AM 09/18/2023    9:28 AM  PHQ 2/9 Scores  PHQ - 2 Score 0 0  PHQ- 9 Score 0 0      Data saved with a previous flowsheet row definition    Vision:Within last year and Dental: No current dental problems and Receives regular dental care    Patient Care Team: Booker Darice SAUNDERS, FNP as PCP - General (Family Medicine)   Outpatient Medications Prior to Visit  Medication Sig   entecavir (BARACLUDE) 0.5 MG tablet Take 0.5 mg by mouth daily.   losartan  (COZAAR ) 50 MG tablet Take 1 tablet (50 mg total) by mouth daily.   metFORMIN  (GLUCOPHAGE -XR) 500 MG 24 hr tablet Take 1 tablet (500 mg total) by mouth daily with breakfast.   valACYclovir (VALTREX) 1000 MG tablet Take 1,000 mg by mouth daily.    ciclopirox  (PENLAC ) 8 % solution Apply topically at bedtime. Apply over nail and surrounding skin. Apply daily over previous coat. After seven (7) days, may remove with alcohol and continue cycle. (Patient not taking: Reported on 03/30/2024)   [DISCONTINUED] nitrofurantoin , macrocrystal-monohydrate, (MACROBID ) 100 MG capsule Take 1 capsule (100 mg total) by mouth 2 (two) times daily.   [DISCONTINUED] tenofovir (VIREAD) 300 MG tablet Take 300 mg by mouth daily.   No facility-administered medications prior to visit.    ROS        Objective:     BP 135/80 (Patient Position: Sitting, Cuff Size: Normal)   Pulse 84   Ht 5' 2 (1.575 m)   Wt 139 lb (63 kg)   LMP  (LMP Unknown)   SpO2 96%   BMI 25.42 kg/m    Physical Exam Vitals and nursing note reviewed.  Constitutional:      General: She is not in acute distress.    Appearance: Normal appearance.  HENT:     Right Ear: Tympanic membrane normal.     Left Ear: Tympanic membrane normal.     Nose: Nose normal.     Mouth/Throat:     Mouth: Mucous membranes are moist.  Pharynx: Oropharynx is clear.  Eyes:     Extraocular Movements: Extraocular movements intact.  Neck:     Thyroid: No thyroid tenderness.  Cardiovascular:     Rate and Rhythm: Normal rate and regular rhythm.     Pulses:          Radial pulses are 2+ on the right side and 2+ on the left side.     Heart sounds: Normal heart sounds, S1 normal and S2 normal.  Pulmonary:     Effort: Pulmonary effort is normal.     Breath sounds: Normal breath sounds.  Abdominal:     General: Bowel sounds are normal.     Palpations: Abdomen is soft.     Tenderness: There is no abdominal tenderness.  Musculoskeletal:        General: Normal range of motion.     Cervical back: Normal range of motion.     Right lower leg: No edema.     Left lower leg: No edema.  Lymphadenopathy:     Cervical:     Right cervical: No superficial cervical adenopathy.    Left cervical: No  superficial cervical adenopathy.  Skin:    General: Skin is warm and dry.  Neurological:     General: No focal deficit present.     Mental Status: She is alert. Mental status is at baseline.  Psychiatric:        Mood and Affect: Mood normal.        Behavior: Behavior normal.        Thought Content: Thought content normal.        Judgment: Judgment normal.      No results found for any visits on 03/30/24.     Assessment & Plan:    Routine Health Maintenance and Physical Exam  Immunization History  Administered Date(s) Administered   Influenza Split 02/01/2015, 02/07/2016   Influenza, Mdck, Trivalent,PF 6+ MOS(egg free) 02/05/2023   Influenza,inj,Quad PF,6+ Mos 01/08/2017, 01/29/2018, 01/01/2020, 02/02/2021   PFIZER(Purple Top)SARS-COV-2 Vaccination 07/09/2019, 08/03/2019, 04/07/2020   PNEUMOCOCCAL CONJUGATE-20 03/06/2021   Pneumococcal Polysaccharide-23 06/09/2018   Tdap 09/11/2021   Typhoid Inactivated 08/06/2023   Zoster Recombinant(Shingrix) 12/08/2020, 06/28/2021    Health Maintenance  Topic Date Due   HIV Screening  Never done   Hepatitis C Screening  Never done   Mammogram  Never done   Hepatitis B Vaccines 19-59 Average Risk (1 of 3 - Risk 3-dose series) 01/01/2020   COVID-19 Vaccine (4 - 2025-26 season) 12/30/2023   Colonoscopy  02/27/2027   Cervical Cancer Screening (HPV/Pap Cotest)  04/02/2028   DTaP/Tdap/Td (2 - Td or Tdap) 09/12/2031   Pneumococcal Vaccine: 50+ Years  Completed   Influenza Vaccine  Completed   Zoster Vaccines- Shingrix  Completed   HPV VACCINES  Aged Out   Meningococcal B Vaccine  Aged Out    Discussed health benefits of physical activity, and encouraged her to engage in regular exercise appropriate for her age and condition.  Annual physical exam -     CBC -     Comprehensive metabolic panel with GFR -     Hemoglobin A1c -     Lipid panel -     TSH + free T4  Encounter for lipid screening for cardiovascular disease -     Lipid  panel  Encounter for screening for metabolic disorder -     Comprehensive metabolic panel with GFR -     Hemoglobin A1c -     TSH +  free T4  Screening for deficiency anemia -     CBC  History of wheezing Assessment & Plan: Reports URI symptoms for one day. Wheezing when she goes to bed. Lungs are clear today in office.  No shortness of breath.  Albuterol inhaler 2 puffs before bed for wheezing. Follow-up if symptoms do not improve. Supportive therapy for URI.   Orders: -     Albuterol Sulfate HFA; Inhale 2 puffs into the lungs at bedtime as needed for wheezing or shortness of breath.  Dispense: 8 g; Refill: 0      Routine labs ordered.  HCM reviewed/discussed. Up to date with pap smear. Anticipatory guidance regarding healthy weight, lifestyle and choices given. Recommend healthy diet.  Recommend approximately 150 minutes/week of moderate intensity exercise. Resistance training is good for building muscles and for bone health. Muscle mass helps to increase our metabolism and to burn more calories at rest.  Limit alcohol consumption: no more than one drink per day for women and 2 drinks per day for me. Recommend regular dental and vision exams. Always use seatbelt/lap and shoulder restraints. Recommend using smoke alarms and checking batteries at least twice a year. Recommend using sunscreen when outside.  Agrees with plan of care discussed.  Questions answered.      Return in about 1 year (around 03/31/2025) for CPE with labs.     Darice JONELLE Brownie, FNP

## 2024-03-31 ENCOUNTER — Ambulatory Visit: Payer: Self-pay | Admitting: Family Medicine

## 2024-03-31 LAB — COMPREHENSIVE METABOLIC PANEL WITH GFR
ALT: 17 IU/L (ref 0–32)
AST: 21 IU/L (ref 0–40)
Albumin: 4.4 g/dL (ref 3.9–4.9)
Alkaline Phosphatase: 58 IU/L (ref 49–135)
BUN/Creatinine Ratio: 21 (ref 12–28)
BUN: 17 mg/dL (ref 8–27)
Bilirubin Total: 0.4 mg/dL (ref 0.0–1.2)
CO2: 23 mmol/L (ref 20–29)
Calcium: 8.9 mg/dL (ref 8.7–10.3)
Chloride: 103 mmol/L (ref 96–106)
Creatinine, Ser: 0.81 mg/dL (ref 0.57–1.00)
Globulin, Total: 2.2 g/dL (ref 1.5–4.5)
Glucose: 103 mg/dL — ABNORMAL HIGH (ref 70–99)
Potassium: 4 mmol/L (ref 3.5–5.2)
Sodium: 139 mmol/L (ref 134–144)
Total Protein: 6.6 g/dL (ref 6.0–8.5)
eGFR: 81 mL/min/1.73 (ref >59)

## 2024-03-31 LAB — LIPID PANEL
Chol/HDL Ratio: 2.7 ratio (ref 0.0–4.4)
Cholesterol, Total: 171 mg/dL (ref 100–199)
HDL: 64 mg/dL (ref >39)
LDL Chol Calc (NIH): 91 mg/dL (ref 0–99)
Triglycerides: 85 mg/dL (ref 0–149)
VLDL Cholesterol Cal: 16 mg/dL (ref 5–40)

## 2024-03-31 LAB — CBC
Hematocrit: 38.8 % (ref 34.0–46.6)
Hemoglobin: 12.8 g/dL (ref 11.1–15.9)
MCH: 31.4 pg (ref 26.6–33.0)
MCHC: 33 g/dL (ref 31.5–35.7)
MCV: 95 fL (ref 79–97)
Platelets: 218 x10E3/uL (ref 150–450)
RBC: 4.08 x10E6/uL (ref 3.77–5.28)
RDW: 12.4 % (ref 11.7–15.4)
WBC: 5.7 x10E3/uL (ref 3.4–10.8)

## 2024-03-31 LAB — TSH+FREE T4
Free T4: 0.99 ng/dL (ref 0.82–1.77)
TSH: 0.601 u[IU]/mL (ref 0.450–4.500)

## 2024-03-31 LAB — HEMOGLOBIN A1C
Est. average glucose Bld gHb Est-mCnc: 111 mg/dL
Hgb A1c MFr Bld: 5.5 % (ref 4.8–5.6)

## 2024-04-08 ENCOUNTER — Encounter: Payer: Self-pay | Admitting: Family Medicine

## 2024-04-08 ENCOUNTER — Ambulatory Visit (INDEPENDENT_AMBULATORY_CARE_PROVIDER_SITE_OTHER): Admitting: Family Medicine

## 2024-04-08 VITALS — BP 130/80 | HR 74 | Temp 97.8°F | Ht 62.0 in | Wt 140.3 lb

## 2024-04-08 DIAGNOSIS — J40 Bronchitis, not specified as acute or chronic: Secondary | ICD-10-CM | POA: Diagnosis not present

## 2024-04-08 MED ORDER — METHYLPREDNISOLONE 4 MG PO TBPK: ORAL_TABLET | ORAL | 0 refills | Status: AC

## 2024-04-08 NOTE — Assessment & Plan Note (Addendum)
 Lungs are clear. Frequent coughing in exam room. No fever, no sinus pain with palpation, no ear pain.  Medrol dose pack for 6 days for cough.  Supportive therapy, warm liquids, elevating to sleep, and humidification.  Follow-up if symptoms do not resolve.

## 2024-04-08 NOTE — Progress Notes (Signed)
° °  Acute Office Visit  Subjective:     Patient ID: Anna Mora, female    DOB: 1960-09-19, 63 y.o.   MRN: [id number]  Chief Complaint  Patient presents with   URI    HPI Patient is in today for URI symptoms. Cough Runny nose with cloudy discharge Wheezing sounds in throat. Frequent coughing in the exam room.  Theraflu at night helps sleep. Symptoms started around 12/1. Albuterol  not helping.  No ear pain or fever.  Lungs are clear.    ROS      Objective:    BP 130/80 (Patient Position: Sitting, Cuff Size: Normal)   Pulse 74   Temp 97.8 F (36.6 C) (Oral)   Ht 5' 2 (1.575 m)   Wt 140 lb 4.8 oz (63.6 kg)   LMP  (LMP Unknown)   SpO2 96%   BMI 25.66 kg/m    Physical Exam Vitals and nursing note reviewed.  Constitutional:      General: She is not in acute distress.    Appearance: Normal appearance. She is normal weight.  HENT:     Right Ear: Tympanic membrane normal.     Left Ear: Tympanic membrane normal.     Nose:     Right Sinus: No maxillary sinus tenderness or frontal sinus tenderness.     Left Sinus: No maxillary sinus tenderness or frontal sinus tenderness.     Mouth/Throat:     Pharynx: Uvula midline. No oropharyngeal exudate or posterior oropharyngeal erythema.  Cardiovascular:     Rate and Rhythm: Normal rate and regular rhythm.     Heart sounds: Normal heart sounds.  Pulmonary:     Effort: Pulmonary effort is normal.     Breath sounds: Normal breath sounds.  Neurological:     Mental Status: She is alert.     No results found for any visits on 04/08/24.      Assessment & Plan:   Problem List Items Addressed This Visit     Bronchitis - Primary   Lungs are clear. Frequent coughing in exam room. No fever, no sinus pain with palpation, no ear pain.  Medrol dose pack for 6 days for cough.  Supportive therapy, warm liquids, elevating to sleep, and humidification.  Follow-up if symptoms do not resolve.       Relevant Medications    methylPREDNISolone (MEDROL DOSEPAK) 4 MG TBPK tablet    Meds ordered this encounter  Medications   methylPREDNISolone (MEDROL DOSEPAK) 4 MG TBPK tablet    Sig: 6-day pack as directed    Dispense:  21 tablet    Refill:  0    Supervising Provider:   METHENEY, CATHERINE D [[id number]]  Agrees with plan of care discussed.  Questions answered.   Return if symptoms worsen or fail to improve.  Darice JONELLE Brownie, FNP

## 2024-06-02 ENCOUNTER — Other Ambulatory Visit: Payer: Self-pay | Admitting: Family Medicine

## 2024-06-02 DIAGNOSIS — I10 Essential (primary) hypertension: Secondary | ICD-10-CM

## 2024-06-02 DIAGNOSIS — R7303 Prediabetes: Secondary | ICD-10-CM

## 2025-03-31 ENCOUNTER — Encounter: Admitting: Family Medicine
